# Patient Record
Sex: Male | Born: 1985 | Hispanic: Yes | Marital: Single | Smoking: Current some day smoker
Health system: Southern US, Community
[De-identification: ages and names within clinical notes are randomized; demographics above are authoritative.]

## PROBLEM LIST (undated history)

## (undated) DIAGNOSIS — E119 Type 2 diabetes mellitus without complications: Secondary | ICD-10-CM

---

## 2006-12-31 ENCOUNTER — Emergency Department: Payer: Self-pay | Admitting: Emergency Medicine

## 2021-12-12 ENCOUNTER — Emergency Department
Admission: EM | Admit: 2021-12-12 | Discharge: 2021-12-12 | Disposition: A | Discharge status: Home / Self Care | Payer: Self-pay | Attending: Emergency Medicine | Admitting: Emergency Medicine

## 2021-12-12 ENCOUNTER — Emergency Department: Payer: Self-pay

## 2021-12-12 ENCOUNTER — Other Ambulatory Visit: Payer: Self-pay

## 2021-12-12 DIAGNOSIS — L0291 Cutaneous abscess, unspecified: Secondary | ICD-10-CM

## 2021-12-12 DIAGNOSIS — R7309 Other abnormal glucose: Secondary | ICD-10-CM | POA: Insufficient documentation

## 2021-12-12 DIAGNOSIS — L0211 Cutaneous abscess of neck: Secondary | ICD-10-CM | POA: Insufficient documentation

## 2021-12-12 LAB — CBC WITH DIFFERENTIAL/PLATELET
Abs Immature Granulocytes: 0.07 10*3/uL (ref 0.00–0.07)
Basophils Absolute: 0 10*3/uL (ref 0.0–0.1)
Basophils Relative: 0 %
Eosinophils Absolute: 0.1 10*3/uL (ref 0.0–0.5)
Eosinophils Relative: 1 %
HCT: 45.4 % (ref 39.0–52.0)
Hemoglobin: 14.5 g/dL (ref 13.0–17.0)
Immature Granulocytes: 0 %
Lymphocytes Relative: 20 %
Lymphs Abs: 3.6 10*3/uL (ref 0.7–4.0)
MCH: 26.4 pg (ref 26.0–34.0)
MCHC: 31.9 g/dL (ref 30.0–36.0)
MCV: 82.7 fL (ref 80.0–100.0)
Monocytes Absolute: 1 10*3/uL (ref 0.1–1.0)
Monocytes Relative: 6 %
Neutro Abs: 13.1 10*3/uL — ABNORMAL HIGH (ref 1.7–7.7)
Neutrophils Relative %: 73 %
Platelets: 397 10*3/uL (ref 150–400)
RBC: 5.49 MIL/uL (ref 4.22–5.81)
RDW: 12.1 % (ref 11.5–15.5)
WBC: 17.9 10*3/uL — ABNORMAL HIGH (ref 4.0–10.5)
nRBC: 0 % (ref 0.0–0.2)

## 2021-12-12 LAB — BASIC METABOLIC PANEL
Anion gap: 10 (ref 5–15)
BUN: 16 mg/dL (ref 6–20)
CO2: 24 mmol/L (ref 22–32)
Calcium: 9.3 mg/dL (ref 8.9–10.3)
Chloride: 100 mmol/L (ref 98–111)
Creatinine, Ser: 0.78 mg/dL (ref 0.61–1.24)
GFR, Estimated: >60 mL/min (ref >60)
Glucose, Bld: 300 mg/dL — ABNORMAL HIGH (ref 70–99)
Potassium: 3.7 mmol/L (ref 3.5–5.1)
Sodium: 134 mmol/L — ABNORMAL LOW (ref 135–145)

## 2021-12-12 MED ORDER — METFORMIN HCL 500 MG PO TABS: 500.0000 mg | ORAL_TABLET | Freq: Two times a day (BID) | ORAL | 11 refills | Status: AC

## 2021-12-12 MED ORDER — DOXYCYCLINE MONOHYDRATE 100 MG PO TABS
100.0000 mg | ORAL_TABLET | Freq: Two times a day (BID) | ORAL | 0 refills | Status: AC
Start: 2021-12-12 — End: ?

## 2021-12-12 MED ORDER — IOHEXOL 300 MG/ML  SOLN
75.0000 mL | Freq: Once | INTRAMUSCULAR | Status: AC | PRN
  Administered 2021-12-12: 75 mL via INTRAVENOUS
  Filled 2021-12-12: qty 75

## 2021-12-12 MED ORDER — LIDOCAINE HCL (PF) 1 % IJ SOLN
10.0000 mL | Freq: Once | INTRAMUSCULAR | Status: AC
  Administered 2021-12-12: 10 mL via INTRADERMAL
  Filled 2021-12-12: qty 10

## 2021-12-12 NOTE — ED Provider Notes (Signed)
? ?[facility] ?Provider Note ? ? ? Event Date/Time  ? First MD Initiated Contact with Patient 12/12/21 [phone]   ?  (approximate) ? ? ?History  ? ?Abscess ? ? ?HPI ? ?Gregory Mayer is a 36 y.o. male presents emergency department with abscess to the back of the neck on the right side.  Patient also has a keloid in that area.  States the swelling and pain have increased over the past week.  States approximately 10 days.  No fever that he knows of.  No chills.  States it is painful to move his neck due to the amount of swelling ? ?  ? ? ?Physical Exam  ? ?Triage Vital Signs: ?ED Triage Vitals  ?Enc Vitals Group  ?   BP 12/12/21 0920 (!) 161/116  ?   Pulse Rate 12/12/21 0920 (!) 120  ?   Resp 12/12/21 0920 18  ?   Temp 12/12/21 0920 98.8 ?F (37.1 ?C)  ?   Temp Source 12/12/21 0920 Oral  ?   SpO2 12/12/21 0920 99 %  ?   Weight 12/12/21 0918 210 lb (95.3 kg)  ?   Height 12/12/21 0918 5\' 11"  (1.803 m)  ?   Head Circumference --   ?   Peak Flow --   ?   Pain Score 12/12/21 0918 8  ?   Pain Loc --   ?   Pain Edu? --   ?   Excl. in [place]? --   ? ? ?Most recent vital signs: ?Vitals:  ? 12/12/21 0920 12/12/21 1128  ?BP: (!) 161/116 (!) 150/100  ?Pulse: (!) 120 100  ?Resp: 18 18  ?Temp: 98.8 ?F (37.1 ?C)   ?SpO2: 99% 99%  ? ? ? ?General: Awake, no distress.   ?CV:  Good peripheral perfusion. regular rate and  rhythm ?Resp:  Normal effort.  ?Abd:  No distention.   ?Other:  Posterior neck has large keloids extending up the back of the patient's head, pustules are also noted in the keloid, swelling at the right side of the neck with a fluctuant area and a pustule measures about 4 cm x 2 cm. ? ? ?ED Results / Procedures / Treatments  ? ?Labs ?(all labs ordered are listed, but only abnormal results are displayed) ?Labs Reviewed  ?CBC WITH DIFFERENTIAL/PLATELET - Abnormal; Notable for the following components:  ?    Result Value  ? WBC 17.9 (*)   ? Neutro Abs 13.1 (*)   ? All other components within normal limits   ?BASIC METABOLIC PANEL - Abnormal; Notable for the following components:  ? Sodium 134 (*)   ? Glucose, Bld 300 (*)   ? All other components within normal limits  ?HEMOGLOBIN A1C  ? ? ? ?EKG ? ? ? ? ?RADIOLOGY ?CT soft tissue of the neck ? ? ? ?PROCEDURES: ? ? ?Procedures ? ? ?MEDICATIONS ORDERED IN ED: ?Medications  ?iohexol (OMNIPAQUE) 300 MG/ML solution 75 mL (75 mLs Intravenous Contrast Given 12/12/21 1055)  ?lidocaine (PF) (XYLOCAINE) 1 % injection 10 mL (10 mLs Intradermal Given by Other 12/12/21 1136)  ? ? ? ?IMPRESSION / MDM / ASSESSMENT AND PLAN / ED COURSE  ?I reviewed the triage vital signs and the nursing notes. ?             ?               ? ?Differential diagnosis includes, but is not limited to, abscess, keloid, cellulitis, mass ? ?  Labs and imaging ordered ? ?CBC has elevated WBC of 17.9, neutrophils are also elevated, 6 basic metabolic panel has elevated glucose of 300 indicating that the patient most likely has diabetes. ? ?CT soft tissue of the neck with contrast was independently reviewed by me.  It does show pockets of abscess noted at the posterior neck towards the right.  Some cellulitis. 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 ? ?See procedure note by Dr. Starleen Blue ? ?Due to the patient's elevated glucose along with infection patient was placed on metformin 500 twice daily, Keflex 500 3 times daily.  He was given several names of clinics that help uninsured people obtain primary care.  He is to follow-up with a primary care about his diabetes.  I do feel that with the glucose being at 300 he is most likely diabetic.  I did add a hemoglobin A1c to help the primary care doctor assess his beginning A1c level since we are starting him on metformin.  Patient is in agreement with treatment plan.  He was discharged in stable condition. ? ? ? ? ?  ? ? ?FINAL CLINICAL IMPRESSION(S) / ED DIAGNOSES  ? ?Final diagnoses:  ?Abscess  ?Elevated glucose  ? ? ? ?Rx / DC Orders  ? ?ED Discharge  Orders   ? ?      Ordered  ?  doxycycline (ADOXA) 100 MG tablet  2 times daily       ? 12/12/21 1156  ?  metFORMIN (GLUCOPHAGE) 500 MG tablet  2 times daily with meals       ? 12/12/21 1156  ? ?  ?  ? ?  ? ? ? ?Note:  This document was prepared using Dragon voice recognition software and may include unintentional dictation errors. ? ?  ?Versie Starks, PA-C ?12/12/21 1517 ? ?  ?Rada Hay, MD ?12/12/21 2015 ? ?

## 2021-12-12 NOTE — Discharge Instructions (Addendum)
Follow-up with one of the above clinics to have your diabetes assessed.  Return emergency department if worsening.  Take the antibiotic as prescribed. ?We are starting you on metformin.  This you will take twice a day.  It is not unusual to get diarrhea when you first start this medication.  It will subside in a few weeks. ? ?

## 2021-12-12 NOTE — ED Triage Notes (Signed)
Patient to ER via POV with abscess present to right posterior neck. Reports area first appeared on Saturday. Denies drainage or fevers at home. Keloid also present to area.  ?

## 2021-12-15 LAB — HEMOGLOBIN A1C
Hgb A1c MFr Bld: 11.4 % — ABNORMAL HIGH (ref 4.8–5.6)
Mean Plasma Glucose: 280 mg/dL

## 2023-03-18 IMAGING — CT CT NECK W/ CM
4 of 6 series · 12 of 35 positions shown, 14 images · IV contrast (APPLIED)
Comparison: None.

CLINICAL DATA: Soft tissue swelling, infection suspected, neck xray
done

EXAM:
CT NECK WITH CONTRAST
TECHNIQUE: Multidetector CT imaging of the neck was performed using the
standard protocol following the bolus administration of intravenous
contrast.

[Series 3: axial neck · axial · 0.43mm/px · z∈[-254,-174]mm · 2 of 121 slices shown, 3 images]
[im 41/121  soft-tissue]
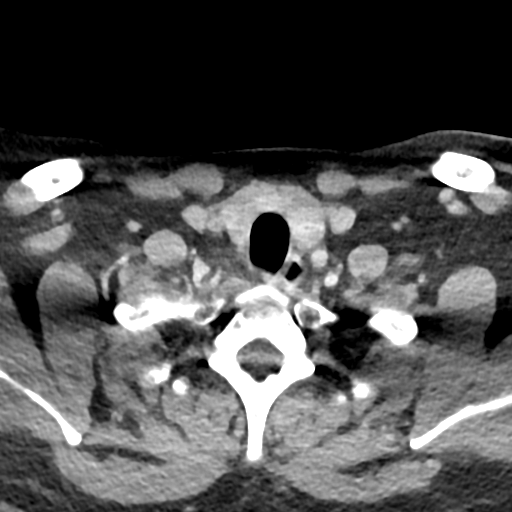
[im 41/121  bone]
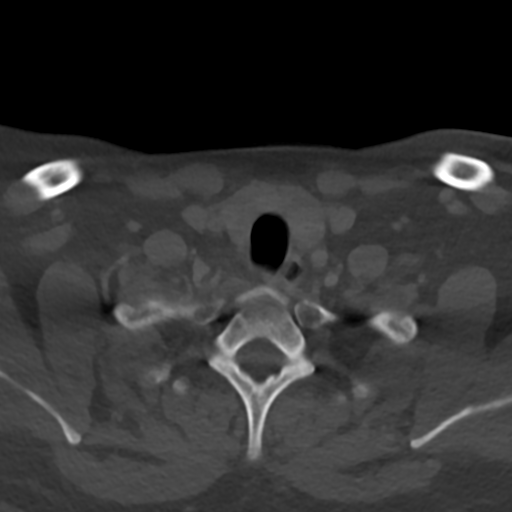
[im 81/121  bone]
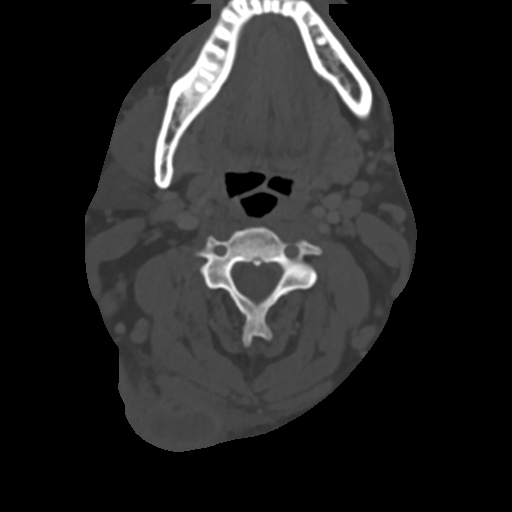

[Series 6: sag neck · sagittal · 0.54mm/px · 5 of 250 slices shown, 6 images]
[im 84/250  bone]
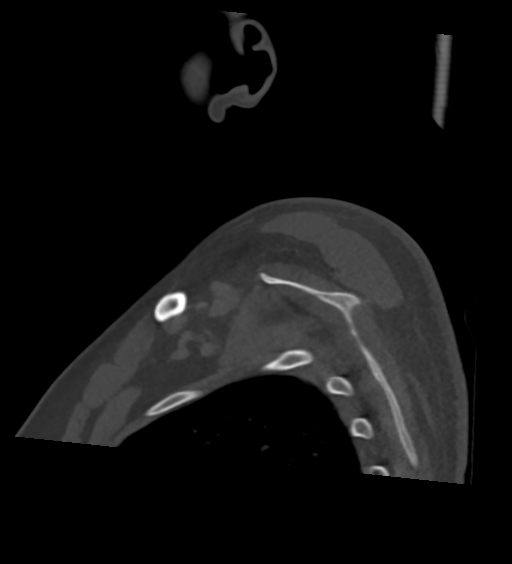
[im 104/250  bone]
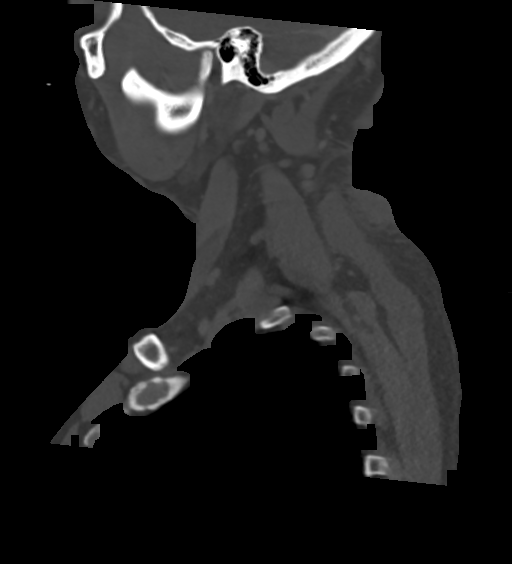
[im 125/250  soft-tissue]
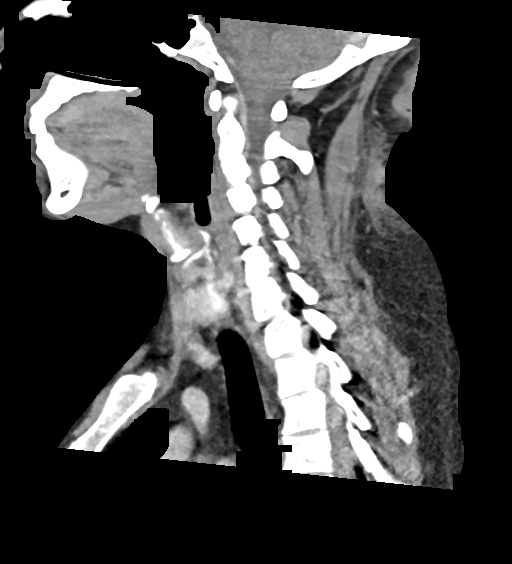
[im 125/250  bone]
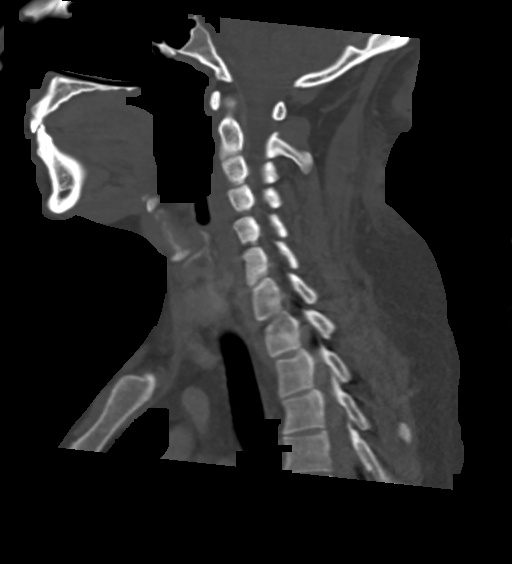
[im 146/250  bone]
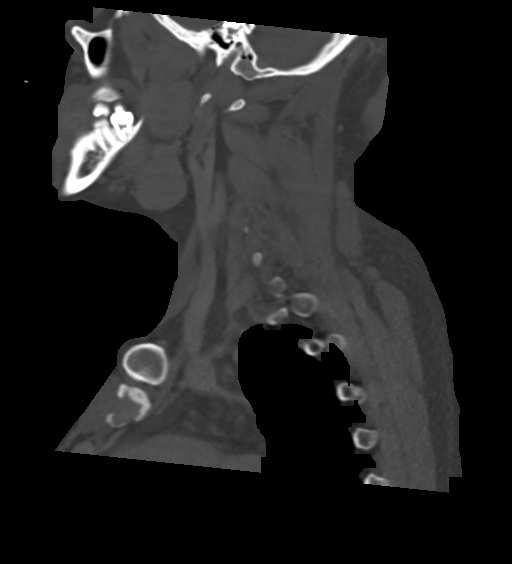
[im 167/250  bone]
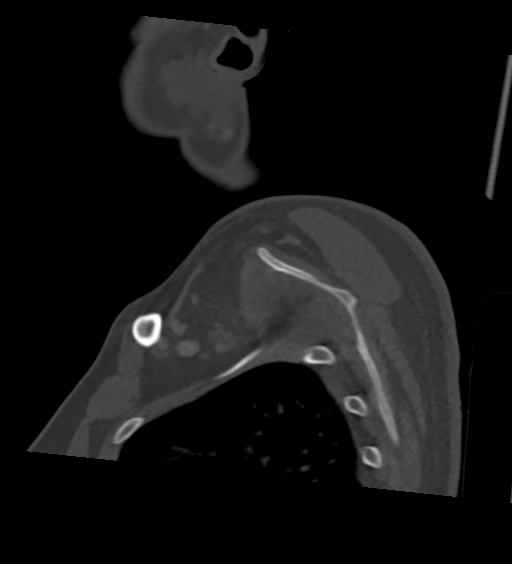

[Series 7: cor neck · coronal · 0.79mm/px · 3 of 143 slices shown]
[im 29/143  bone]
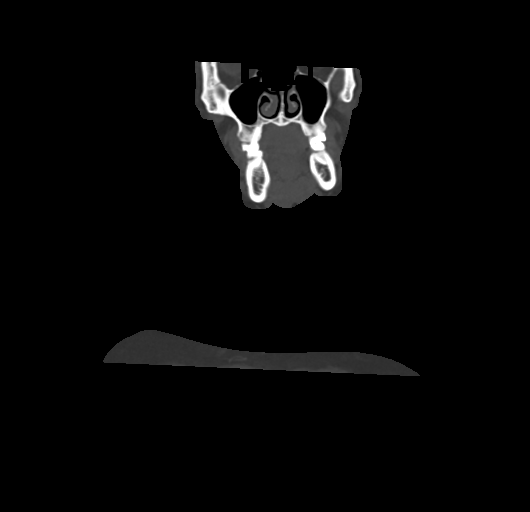
[im 57/143  bone]
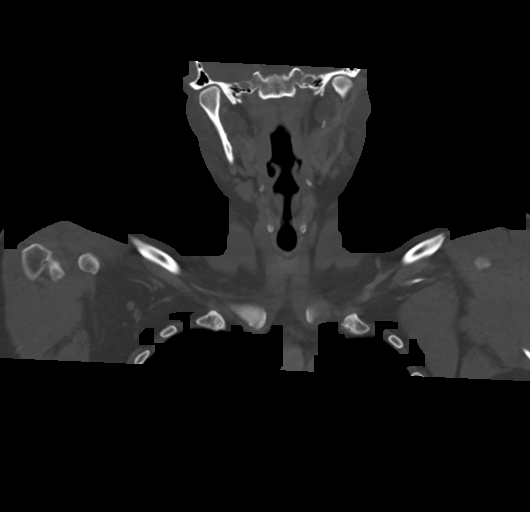
[im 86/143  bone]
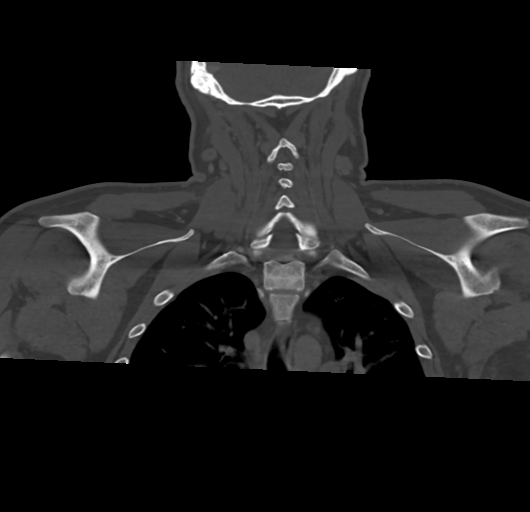

[Series 8: ax oropharynx · axial · 0.70mm/px · z∈[-267,-185]mm · 2 of 125 slices shown]
[im 42/125  bone]
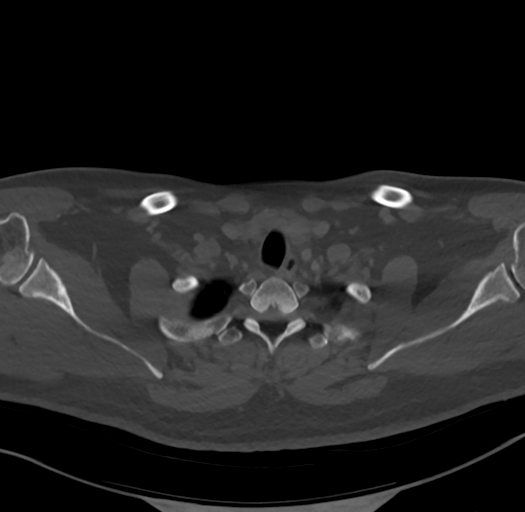
[im 83/125  bone]
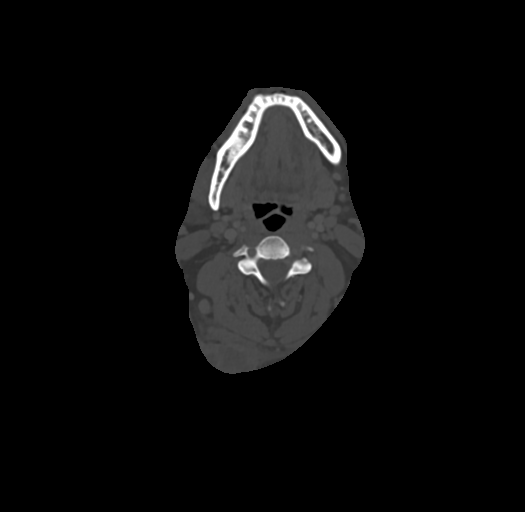

[12 of 35 positions shown; findings below may reference images not displayed]

RADIATION DOSE REDUCTION: This exam was performed according to the
departmental dose-optimization program which includes automated
exposure control, adjustment of the mA and/or kV according to
patient size and/or use of iterative reconstruction technique.

CONTRAST:  75mL OMNIPAQUE IOHEXOL 300 MG/ML  SOLN
FINDINGS: Pharynx and larynx: Normal. No mass or swelling.

Salivary glands: No inflammation, mass, or stone.

Thyroid: Normal.

Lymph nodes: Enlarged lymph node with additional prominent postop
upper cervical nodes bilaterally.

Vascular: Limited evaluation due to non arterial contrast bolus
timing.

Limited intracranial: No obvious acute abnormality in the visualized
brain.

Visualized orbits: Negative.

Mastoids and visualized paranasal sinuses: Opacified left posterior
ethmoid air cell. Otherwise, clear.

Skeleton: No evidence of acute fracture.

Upper chest: Visualized lung apices are clear.

Other: Multiloculated peripherally enhancing superficial fluid
collection in the superficial posterior right paraspinal soft
tissues in the mid to upper neck, compatible with abscess. This
fluid collection measures up to 3.6 x 2.4 x 3.2 cm on series 3,
image 45 and series 6, image 114. Surrounding edema, soft tissue
thickening and skin thickening, compatible with cellulitis.
IMPRESSION: 1. Findings compatible with a multiloculated 3.6 x 2.4 x 3.2 cm
superficial abscess in the posterior right paraspinal soft tissues
of the mid to upper right neck with surrounding cellulitis and skin
thickening.
2. Enlarged posterior upper cervical lymph nodes, nonspecific but
probably reactive given above findings.

## 2023-04-02 ENCOUNTER — Emergency Department
Admission: EM | Admit: 2023-04-02 | Discharge: 2023-04-02 | Disposition: A | Discharge status: Home / Self Care | Payer: Self-pay | Attending: Emergency Medicine | Admitting: Emergency Medicine

## 2023-04-02 ENCOUNTER — Other Ambulatory Visit: Payer: Self-pay

## 2023-04-02 DIAGNOSIS — R000 Tachycardia, unspecified: Secondary | ICD-10-CM | POA: Insufficient documentation

## 2023-04-02 DIAGNOSIS — L0291 Cutaneous abscess, unspecified: Secondary | ICD-10-CM

## 2023-04-02 DIAGNOSIS — L0211 Cutaneous abscess of neck: Secondary | ICD-10-CM | POA: Insufficient documentation

## 2023-04-02 DIAGNOSIS — D72829 Elevated white blood cell count, unspecified: Secondary | ICD-10-CM | POA: Insufficient documentation

## 2023-04-02 HISTORY — DX: Type 2 diabetes mellitus without complications: E11.9

## 2023-04-02 LAB — CBC
HCT: 43.6 % (ref 39.0–52.0)
Hemoglobin: 14.4 g/dL (ref 13.0–17.0)
MCH: 27.4 pg (ref 26.0–34.0)
MCHC: 33 g/dL (ref 30.0–36.0)
MCV: 82.9 fL (ref 80.0–100.0)
Platelets: 361 10*3/uL (ref 150–400)
RBC: 5.26 MIL/uL (ref 4.22–5.81)
RDW: 12.3 % (ref 11.5–15.5)
WBC: 20.8 10*3/uL — ABNORMAL HIGH (ref 4.0–10.5)
nRBC: 0 % (ref 0.0–0.2)

## 2023-04-02 LAB — BASIC METABOLIC PANEL
Anion gap: 10 (ref 5–15)
BUN: 14 mg/dL (ref 6–20)
CO2: 22 mmol/L (ref 22–32)
Calcium: 9.2 mg/dL (ref 8.9–10.3)
Chloride: 100 mmol/L (ref 98–111)
Creatinine, Ser: 0.64 mg/dL (ref 0.61–1.24)
GFR, Estimated: >60 mL/min (ref >60)
Glucose, Bld: 245 mg/dL — ABNORMAL HIGH (ref 70–99)
Potassium: 3.7 mmol/L (ref 3.5–5.1)
Sodium: 132 mmol/L — ABNORMAL LOW (ref 135–145)

## 2023-04-02 LAB — LACTIC ACID, PLASMA: Lactic Acid, Venous: 1.3 mmol/L (ref 0.5–1.9)

## 2023-04-02 MED ORDER — CEPHALEXIN 500 MG PO CAPS: 500.0000 mg | ORAL_CAPSULE | Freq: Two times a day (BID) | ORAL | 0 refills | Status: AC

## 2023-04-02 MED ORDER — LIDOCAINE-EPINEPHRINE 2 %-1:100000 IJ SOLN
20.0000 mL | Freq: Once | INTRAMUSCULAR | Status: AC
Start: 2023-04-02 — End: 2023-04-02
  Administered 2023-04-02: 20 mL via INTRADERMAL
  Filled 2023-04-02: qty 1

## 2023-04-02 MED ORDER — SULFAMETHOXAZOLE-TRIMETHOPRIM 800-160 MG PO TABS: 1.0000 | ORAL_TABLET | Freq: Two times a day (BID) | ORAL | 0 refills | Status: AC

## 2023-04-02 NOTE — ED Triage Notes (Signed)
Pt to ED for abscess to back right neck since Friday. Reports chills and fevers.

## 2023-04-02 NOTE — ED Provider Notes (Signed)
[facility] Provider Note    Event Date/Time   First MD Initiated Contact with Patient 04/02/23 1230     (approximate)   History   Abscess   HPI  Gregory Mayer is a 37 y.o. male who presents with complaints of abscess on the posterior right neck.  Patient reports history of keloids to the posterior neck in the past as well as an abscess that was drained nearly a year ago in a similar location.     Physical Exam   Triage Vital Signs: ED Triage Vitals [04/02/23 1113]  Enc Vitals Group     BP (!) 144/107     Pulse Rate (!) 131     Resp 18     Temp 99.9 F (37.7 C)     Temp src      SpO2 98 %     Weight 90.7 kg (200 lb)     Height 1.803 m (5\' 11" )     Head Circumference      Peak Flow      Pain Score 7     Pain Loc      Pain Edu?      Excl. in [place]?     Most recent vital signs: Vitals:   04/02/23 1113 04/02/23 1240  BP: (!) 144/107 (!) 138/98  Pulse: (!) 131 (!) 110  Resp: 18 18  Temp: 99.9 F (37.7 C)   SpO2: 98% 98%     General: Awake, no distress.  CV:  Good peripheral perfusion.  Heart rate 88 Resp:  Normal effort.  Abd:  No distention.  Other:  Multiple keloids to the posterior neck and hairline, tender fluctuant erythematous area consistent with abscess   ED Results / Procedures / Treatments   Labs (all labs ordered are listed, but only abnormal results are displayed) Labs Reviewed  CBC - Abnormal; Notable for the following components:      Result Value   WBC 20.8 (*)    All other components within normal limits  BASIC METABOLIC PANEL - Abnormal; Notable for the following components:   Sodium 132 (*)    Glucose, Bld 245 (*)    All other components within normal limits  LACTIC ACID, PLASMA     EKG     RADIOLOGY     PROCEDURES:  Critical Care performed:   Marland KitchenMarland KitchenIncision and Drainage  Date/Time: 04/02/2023 3:42 PM  Performed by: Jene Every, MD Authorized by: Jene Every, MD   Consent:    Consent  obtained:  Verbal   Consent given by:  Patient   Risks discussed:  Bleeding, incomplete drainage, pain, damage to other organs and infection   Alternatives discussed:  No treatment Location:    Type:  Abscess   Location:  Neck   Neck location:  R posterior Pre-procedure details:    Skin preparation:  Chlorhexidine with alcohol Sedation:    Sedation type:  None Anesthesia:    Anesthesia method:  Local infiltration   Local anesthetic:  Lidocaine 2% WITH epi Procedure type:    Complexity:  Complex Procedure details:    Incision types:  Single straight   Incision depth:  Submucosal   Wound management:  Probed and deloculated   Drainage:  Purulent   Drainage amount:  Copious   Packing materials:  1/4 in iodoform gauze Post-procedure details:    Procedure completion:  Tolerated well, no immediate complications    MEDICATIONS ORDERED IN ED: Medications  lidocaine-EPINEPHrine (XYLOCAINE W/EPI) 2 %-1:100000 (  with pres) injection 20 mL (20 mLs Intradermal Given by Other 04/02/23 1347)     IMPRESSION / MDM / ASSESSMENT AND PLAN / ED COURSE  I reviewed the triage vital signs and the nursing notes. Patient's presentation is most consistent with acute presentation with potential threat to life or bodily function.  Patient presents with tachycardia and elevated temperature as detailed above.  He has abscess to the posterior right neck, similar presentation to nearly a year ago.  His lactic acid is reassuring, he does have an elevated white blood cell count consistent with an infection.  Heart rate was normal on my exam and overall he is well-appearing  We discussed his elevated white blood cell count and high heart rate, he reports that he improved rapidly after having ED drainage of abscess last time and he desires that today.  I with him the location is higher risk but he did give consent verbally to proceed with I&D  I&D successful, patient tolerated well, will discharge on 2  antibiotics with 2-day wound check, sooner if any worsening        FINAL CLINICAL IMPRESSION(S) / ED DIAGNOSES   Final diagnoses:  Abscess     Rx / DC Orders   ED Discharge Orders          Ordered    sulfamethoxazole-trimethoprim (BACTRIM DS) 800-160 MG tablet  2 times daily        04/02/23 1347    cephALEXin (KEFLEX) 500 MG capsule  2 times daily        04/02/23 1347             Note:  This document was prepared using Dragon voice recognition software and may include unintentional dictation errors.   Jene Every, MD 04/02/23 [phone]

## 2023-04-02 NOTE — ED Notes (Signed)
See triage note  Presents with possible abscess area to right side of neck  States he developed area 1-2 days   Low grade temp noted on arrival
